# Patient Record
Sex: Male | Born: 1965 | Race: White | Hispanic: No | Smoking: Current every day smoker
Health system: Southern US, Community
[De-identification: ages and names within clinical notes are randomized; demographics above are authoritative.]

## PROBLEM LIST (undated history)

## (undated) DIAGNOSIS — E119 Type 2 diabetes mellitus without complications: Secondary | ICD-10-CM

## (undated) DIAGNOSIS — I10 Essential (primary) hypertension: Secondary | ICD-10-CM

## (undated) DIAGNOSIS — N200 Calculus of kidney: Secondary | ICD-10-CM

---

## 2020-12-22 ENCOUNTER — Encounter: Payer: Self-pay | Admitting: Intensive Care

## 2020-12-22 ENCOUNTER — Emergency Department: Payer: 59

## 2020-12-22 ENCOUNTER — Emergency Department
Admission: EM | Admit: 2020-12-22 | Discharge: 2020-12-22 | Disposition: A | Discharge status: Home / Self Care | Payer: 59 | Attending: Emergency Medicine | Admitting: Emergency Medicine

## 2020-12-22 ENCOUNTER — Other Ambulatory Visit: Payer: Self-pay

## 2020-12-22 DIAGNOSIS — Z7984 Long term (current) use of oral hypoglycemic drugs: Secondary | ICD-10-CM | POA: Diagnosis not present

## 2020-12-22 DIAGNOSIS — E119 Type 2 diabetes mellitus without complications: Secondary | ICD-10-CM | POA: Insufficient documentation

## 2020-12-22 DIAGNOSIS — F17210 Nicotine dependence, cigarettes, uncomplicated: Secondary | ICD-10-CM | POA: Insufficient documentation

## 2020-12-22 DIAGNOSIS — R109 Unspecified abdominal pain: Secondary | ICD-10-CM | POA: Diagnosis present

## 2020-12-22 DIAGNOSIS — I10 Essential (primary) hypertension: Secondary | ICD-10-CM | POA: Insufficient documentation

## 2020-12-22 DIAGNOSIS — Z79899 Other long term (current) drug therapy: Secondary | ICD-10-CM | POA: Insufficient documentation

## 2020-12-22 DIAGNOSIS — N200 Calculus of kidney: Secondary | ICD-10-CM | POA: Diagnosis not present

## 2020-12-22 HISTORY — DX: Type 2 diabetes mellitus without complications: E11.9

## 2020-12-22 HISTORY — DX: Essential (primary) hypertension: I10

## 2020-12-22 HISTORY — DX: Calculus of kidney: N20.0

## 2020-12-22 LAB — BASIC METABOLIC PANEL
Anion gap: 11 (ref 5–15)
BUN: 24 mg/dL — ABNORMAL HIGH (ref 6–20)
CO2: 24 mmol/L (ref 22–32)
Calcium: 9.3 mg/dL (ref 8.9–10.3)
Chloride: 99 mmol/L (ref 98–111)
Creatinine, Ser: 1.46 mg/dL — ABNORMAL HIGH (ref 0.61–1.24)
GFR, Estimated: 57 mL/min — ABNORMAL LOW (ref >60)
Glucose, Bld: 109 mg/dL — ABNORMAL HIGH (ref 70–99)
Potassium: 3.8 mmol/L (ref 3.5–5.1)
Sodium: 134 mmol/L — ABNORMAL LOW (ref 135–145)

## 2020-12-22 LAB — URINALYSIS, COMPLETE (UACMP) WITH MICROSCOPIC
Bacteria, UA: NONE SEEN
Bilirubin Urine: NEGATIVE
Glucose, UA: >=500 mg/dL — AB
Hgb urine dipstick: NEGATIVE
Ketones, ur: 5 mg/dL — AB
Leukocytes,Ua: NEGATIVE
Nitrite: NEGATIVE
Protein, ur: NEGATIVE mg/dL
Specific Gravity, Urine: 1.027 (ref 1.005–1.030)
Squamous Epithelial / HPF: NONE SEEN (ref 0–5)
pH: 5 (ref 5.0–8.0)

## 2020-12-22 LAB — CBC
HCT: 42 % (ref 39.0–52.0)
Hemoglobin: 14.5 g/dL (ref 13.0–17.0)
MCH: 29.5 pg (ref 26.0–34.0)
MCHC: 34.5 g/dL (ref 30.0–36.0)
MCV: 85.4 fL (ref 80.0–100.0)
Platelets: 256 10*3/uL (ref 150–400)
RBC: 4.92 MIL/uL (ref 4.22–5.81)
RDW: 12.5 % (ref 11.5–15.5)
WBC: 16.3 10*3/uL — ABNORMAL HIGH (ref 4.0–10.5)
nRBC: 0 % (ref 0.0–0.2)

## 2020-12-22 MED ORDER — OXYCODONE-ACETAMINOPHEN 5-325 MG PO TABS
1.0000 | ORAL_TABLET | ORAL | Status: DC | PRN
Start: 2020-12-22 — End: 2020-12-23
  Administered 2020-12-22: 1 via ORAL
  Filled 2020-12-22: qty 1

## 2020-12-22 MED ORDER — ONDANSETRON 4 MG PO TBDP
4.0000 mg | ORAL_TABLET | Freq: Once | ORAL | Status: AC
  Administered 2020-12-22: 4 mg via ORAL
  Filled 2020-12-22: qty 1

## 2020-12-22 MED ORDER — ONDANSETRON 4 MG PO TBDP
ORAL_TABLET | ORAL | Status: AC
  Administered 2020-12-22: 4 mg via ORAL
  Filled 2020-12-22: qty 1

## 2020-12-22 MED ORDER — OXYCODONE-ACETAMINOPHEN 5-325 MG PO TABS
1.0000 | ORAL_TABLET | Freq: Once | ORAL | Status: AC
  Administered 2020-12-22: 1 via ORAL
  Filled 2020-12-22: qty 1

## 2020-12-22 MED ORDER — TAMSULOSIN HCL 0.4 MG PO CAPS: 0.4000 mg | ORAL_CAPSULE | Freq: Every day | ORAL | 0 refills | Status: AC

## 2020-12-22 MED ORDER — ONDANSETRON 4 MG PO TBDP: 4.0000 mg | ORAL_TABLET | Freq: Once | ORAL | Status: AC | PRN

## 2020-12-22 MED ORDER — OXYCODONE-ACETAMINOPHEN 5-325 MG PO TABS
1.0000 | ORAL_TABLET | Freq: Four times a day (QID) | ORAL | 0 refills | Status: AC | PRN
Start: 2020-12-22 — End: 2020-12-27

## 2020-12-22 NOTE — ED Provider Notes (Signed)
[facility]-EMERGENCY DEPARTMENT  ____________________________________________  Time seen: Approximately 7:12 PM  I have reviewed the triage vital signs and the nursing notes.   HISTORY  Chief Complaint Flank Pain   Historian Patient     HPI Samuel Ingram is a 55 y.o. male with a history of diabetes, hypertension nephrolithiasis, presents to the emergency department with left-sided flank pain for the past 4 days.  Patient endorses nausea but no vomiting.  Denies fever and chills.  He states that flank pain seems to radiate to the groin.  He states that pain feels similar to episodes of nephrolithiasis in the past.  No falls or mechanisms of trauma.  No other alleviating measures have been attempted   Past Medical History:  Diagnosis Date  . Diabetes mellitus without complication (HCC)   . Hypertension   . Kidney stones      Immunizations up to date:  Yes.     Past Medical History:  Diagnosis Date  . Diabetes mellitus without complication (HCC)   . Hypertension   . Kidney stones     There are no problems to display for this patient.   History reviewed. No pertinent surgical history.  Prior to Admission medications   Medication Sig Start Date End Date Taking? Authorizing Provider  atorvastatin (LIPITOR) 40 MG tablet Take 40 mg by mouth daily.   Yes [provider]  carvedilol (COREG) 12.5 MG tablet Take 12.5 mg by mouth 2 (two) times daily with a meal.   Yes [provider]  empagliflozin (JARDIANCE) 10 MG TABS tablet Take by mouth daily.   Yes [provider]  furosemide (LASIX) 40 MG tablet Take 40 mg by mouth.   Yes [provider]  losartan (COZAAR) 50 MG tablet Take 50 mg by mouth daily.   Yes [provider]  metFORMIN (GLUCOPHAGE) 1000 MG tablet Take 1,000 mg by mouth 2 (two) times daily with a meal.   Yes [provider]  oxyCODONE-acetaminophen (PERCOCET/ROXICET) 5-325 MG tablet Take 1 tablet by mouth  every 6 (six) hours as needed for up to 5 days. 12/22/20 12/27/20 Yes Pia MauWoods, Catherene Kaleta M, PA-C  potassium chloride SA (KLOR-CON) 20 MEQ tablet Take 20 mEq by mouth 2 (two) times daily.   Yes [provider]  Spironolactone 25 MG/5ML SUSP Take by mouth.   Yes [provider]  tamsulosin (FLOMAX) 0.4 MG CAPS capsule Take 1 capsule (0.4 mg total) by mouth daily for 7 days. 12/22/20 12/29/20 Yes Pia MauWoods, Sinai Illingworth M, PA-C    Allergies Penicillins  History reviewed. No pertinent family history.  Social History Social History   Tobacco Use  . Smoking status: Current Every Day Smoker    Types: Cigarettes  . Smokeless tobacco: Never Used  Substance Use Topics  . Alcohol use: Never  . Drug use: Never     Review of Systems  Constitutional: No fever/chills Eyes:  No discharge ENT: No upper respiratory complaints. Respiratory: no cough. No SOB/ use of accessory muscles to breath Gastrointestinal:   No nausea, no vomiting.  No diarrhea.  No constipation. Genitourinary: Patient has flank pain.  Musculoskeletal: Negative for musculoskeletal pain. Skin: Negative for rash, abrasions, lacerations, ecchymosis.    ____________________________________________   PHYSICAL EXAM:  VITAL SIGNS: ED Triage Vitals  Enc Vitals Group     BP 12/22/20 1717 (!) 168/91     Pulse Rate 12/22/20 1717 (!) 114     Resp 12/22/20 1717 18     Temp 12/22/20 1717 98.4 F (36.9  C)     Temp Source 12/22/20 1717 Oral     SpO2 12/22/20 1717 97 %     Weight 12/22/20 1743 240 lb (108.9 kg)     Height 12/22/20 1743 5\' 10"  (1.778 m)     Head Circumference --      Peak Flow --      Pain Score 12/22/20 1717 10     Pain Loc --      Pain Edu? --      Excl. in GC? --      Constitutional: Alert and oriented. Well appearing and in no acute distress. Eyes: Conjunctivae are normal. PERRL. EOMI. Head: Atraumatic. Cardiovascular: Normal rate, regular rhythm. Normal S1 and S2.  Good peripheral  circulation. Respiratory: Normal respiratory effort without tachypnea or retractions. Lungs CTAB. Good air entry to the bases with no decreased or absent breath sounds Gastrointestinal: Bowel sounds x 4 quadrants. Soft and nontender to palpation. No guarding or rigidity. No distention. Genitourinary: Patient has left sided flank pain.  Musculoskeletal: Full range of motion to all extremities. No obvious deformities noted Neurologic:  Normal for age. No gross focal neurologic deficits are appreciated.  Skin:  Skin is warm, dry and intact. No rash noted. Psychiatric: Mood and affect are normal for age. Speech and behavior are normal.   ____________________________________________   LABS (all labs ordered are listed, but only abnormal results are displayed)  Labs Reviewed  URINALYSIS, COMPLETE (UACMP) WITH MICROSCOPIC - Abnormal; Notable for the following components:      Result Value   Color, Urine YELLOW (*)    APPearance CLEAR (*)    Glucose, UA >=500 (*)    Ketones, ur 5 (*)    All other components within normal limits  BASIC METABOLIC PANEL - Abnormal; Notable for the following components:   Sodium 134 (*)    Glucose, Bld 109 (*)    BUN 24 (*)    Creatinine, Ser 1.46 (*)    GFR, Estimated 57 (*)    All other components within normal limits  CBC - Abnormal; Notable for the following components:   WBC 16.3 (*)    All other components within normal limits   ____________________________________________  EKG   ____________________________________________  RADIOLOGY 12/24/20, personally viewed and evaluated these images (plain radiographs) as part of my medical decision making, as well as reviewing the written report by the radiologist.    CT Renal Stone Study  Result Date: 12/22/2020 CLINICAL DATA:  Left flank pain EXAM: CT ABDOMEN AND PELVIS WITHOUT CONTRAST TECHNIQUE: Multidetector CT imaging of the abdomen and pelvis was performed following the standard  protocol without oral or IV contrast. COMPARISON:  None. FINDINGS: Lower chest: Lung bases are clear. Hepatobiliary: There is hepatic steatosis. No focal liver lesions are evident on this noncontrast enhanced study. Gallbladder is absent. There is no appreciable biliary duct dilatation. Pancreas: There is no pancreatic mass or inflammatory focus. Spleen: No splenic lesions are appreciable. Adrenals/Urinary Tract: Adrenals bilaterally appear unremarkable. Left kidney is subtly edematous with mild soft tissue stranding in the left perinephric fascia. There is no evident renal mass on either side. There is moderately severe hydronephrosis on the left. There is no appreciable hydronephrosis on the right. On the right, there is a 3 mm calculus in the lower pole region. On the left, there is a calculus in the lower pole region measuring 7 x 7 mm with an adjacent 5 x 4 mm calculus. There is a calculus in  the proximal left ureter slightly beyond the ureteropelvic junction at the L4 level measuring 7 x 6 mm. No other ureteral calculi are appreciable. The urinary bladder is midline. There is thickening of the urinary bladder wall. Stomach/Bowel: There is no appreciable bowel wall or mesenteric thickening. There is no appreciable bowel obstruction. Terminal ileum appears unremarkable. There is no evident free air or portal venous air. Vascular/Lymphatic: No abdominal aortic aneurysm. No appreciable vascular lesions evident on this noncontrast enhanced study. There is no evident adenopathy in the abdomen or pelvis by size criteria. Subcentimeter mid abdominal mesenteric lymph nodes are subcentimeter and considered nonspecific. Reproductive: There are prostatic calculi. Prostate and seminal vesicles are normal in size and contour. Other: Appendix appears normal. No abscess or ascites evident in the abdomen pelvis. There is localized mesenteric thickening in the mid abdomen. There is no distortion of vessels or bowel.  Subcentimeter mesenteric lymph nodes are noted in this area without adenopathy. This area of subtle mesenteric sclerosis spans a distance of 6.5 x 4.3 cm. Musculoskeletal: Degenerative change noted in the thoracic spine. No blastic or lytic bone lesions evident. No intramuscular lesions. IMPRESSION: 1. There is a 7 x 6 mm calculus in the proximal left ureter at the L4 level causing moderately severe hydronephrosis and proximal left ureterectasis. Left kidney is mildly edematous. 2. There are intrarenal calculi, larger on the left than on the right. 3. Urinary bladder wall thickening, a finding likely indicative of a degree of cystitis. 4. Mesenteric stranding noted in the mid abdomen without distortion of bowel or vessels. 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 This finding is likely benign but is felt to warrant a follow-up study in approximately 6 months to assess for clearing. 5. No bowel wall thickening or bowel obstruction. No abscess in the abdomen or pelvis. Appendix appears normal. 6.  Hepatic steatosis. Electronically Signed   By: Bretta Bang III M.D.   On: 12/22/2020 20:04    ____________________________________________    PROCEDURES  Procedure(s) performed:     Procedures     Medications  oxyCODONE-acetaminophen (PERCOCET/ROXICET) 5-325 MG per tablet 1 tablet (1 tablet Oral Given 12/22/20 1749)  ondansetron (ZOFRAN-ODT) disintegrating tablet 4 mg (4 mg Oral Given 12/22/20 1749)  oxyCODONE-acetaminophen (PERCOCET/ROXICET) 5-325 MG per tablet 1 tablet (1 tablet Oral Given 12/22/20 2023)  ondansetron (ZOFRAN-ODT) disintegrating tablet 4 mg (4 mg Oral Given 12/22/20 2024)     ____________________________________________   INITIAL IMPRESSION / ASSESSMENT AND PLAN / ED COURSE  Pertinent labs & imaging results that were available during my care of the patient were reviewed by me and considered in my medical decision making (see chart for details).      Assessment and  Plan:  Flank pain 55 year old male presents to the emergency department with left-sided flank pain for the past 4 days.  Patient was hypertensive at triage but vital signs were otherwise reassuring.  He had mild leukocytosis.  Creatinine was mildly elevated as well as BUN.  Urinalysis showed no signs of UTI.  CT renal stone study shows a 7 x 6 mm renal stone.  Patient was discharged with Flomax and Roxicet.  Referral to urology was given, Dr. Lonna Cobb.     ____________________________________________  FINAL CLINICAL IMPRESSION(S) / ED DIAGNOSES  Final diagnoses:  Nephrolithiasis      NEW MEDICATIONS STARTED DURING THIS VISIT:  ED Discharge Orders         Ordered    oxyCODONE-acetaminophen (PERCOCET/ROXICET) 5-325 MG tablet  Every 6 hours PRN  12/22/20 2030    tamsulosin (FLOMAX) 0.4 MG CAPS capsule  Daily        12/22/20 2030              This chart was dictated using voice recognition software/Dragon. Despite best efforts to proofread, errors can occur which can change the meaning. Any change was purely unintentional.     Orvil Feil, PA-C 12/22/20 2140    Gilles Chiquito, MD 12/23/20 [phone]

## 2020-12-22 NOTE — ED Triage Notes (Signed)
Left sided flank pain X4 days. HX kidney stones and reports feels the same. Urinary frequency with little results

## 2020-12-22 NOTE — ED Notes (Signed)
Provided Dc instructions. Verbalized understanding. Alert, oriented, and ambulatory on discharge. No acute distress.

## 2020-12-22 NOTE — ED Notes (Signed)
See triage note  Presents with left sided flank pain  States pain started 4 days ago   Hx of renal stones  No fever or n/v

## 2020-12-22 NOTE — ED Triage Notes (Signed)
First Nurse Note:  C/O left sided flank pain x 4 days.  States has had a kidney stone in the past and symptoms are similar.  AAOx3.  Skin warm and dry. NAD

## 2020-12-22 NOTE — Discharge Instructions (Signed)
Take Percocet as directed. Please start stool softener to avoid constipation. Please make follow-up appointment with urology.

## 2020-12-25 ENCOUNTER — Telehealth: Payer: Self-pay | Admitting: Urology

## 2020-12-25 NOTE — Telephone Encounter (Signed)
-----   Message from Riki Altes, MD sent at 12/25/2020  7:27 AM EST ----- Regarding: Appointment Seen in ED recently for stone.  Please schedule appointment with me

## 2020-12-25 NOTE — Telephone Encounter (Signed)
Left message for patient to call back to schedule a NP ER follow up appt with Dr. Lonna Cobb Anytime next available   [facility]

## 2021-10-28 IMAGING — CT CT RENAL STONE PROTOCOL
3 of 4 series · 8 of 46 positions shown, 15 images · non-contrast
Comparison: None.

CLINICAL DATA: Left flank pain

EXAM:
CT ABDOMEN AND PELVIS WITHOUT CONTRAST
TECHNIQUE: Multidetector CT imaging of the abdomen and pelvis was performed
following the standard protocol without oral or IV contrast.

[Series 4: lung bases · axial · 0.90mm/px · z∈[-682,-617]mm · 4 of 23 slices shown, 9 images]
[im 5/23  soft-tissue]
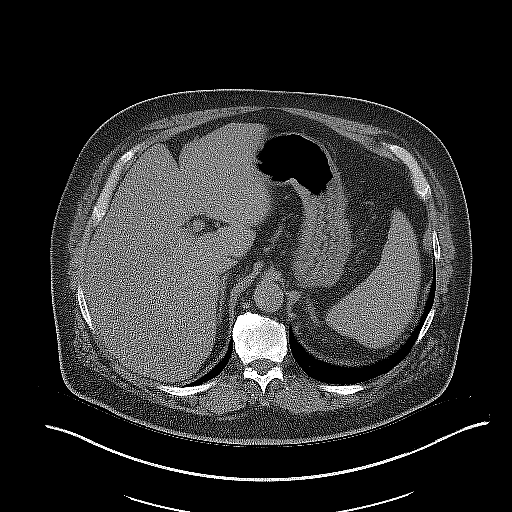
[im 5/23  lung]
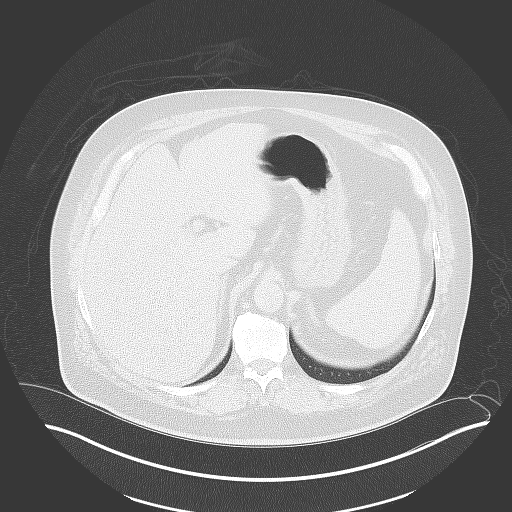
[im 5/23  bone]
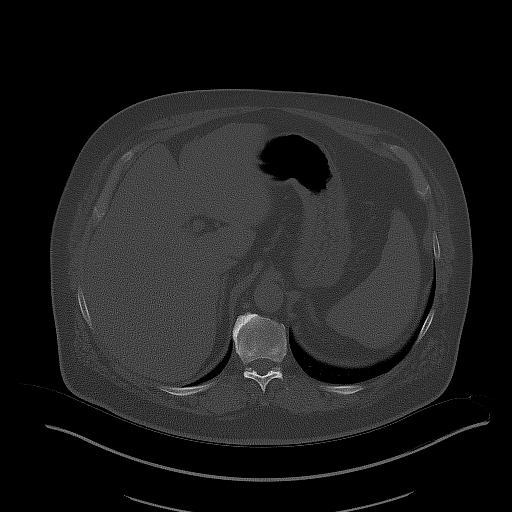
[im 9/23  soft-tissue]
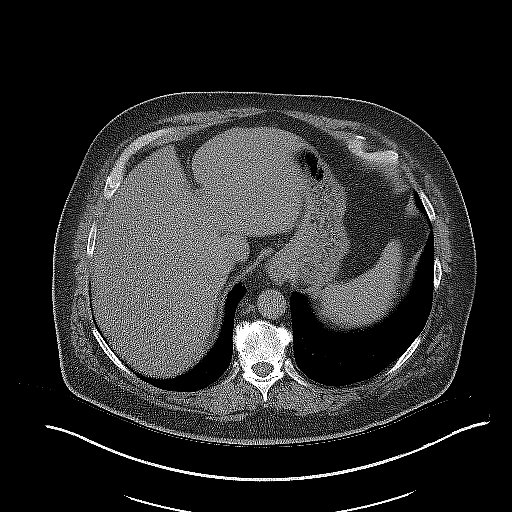
[im 9/23  lung]
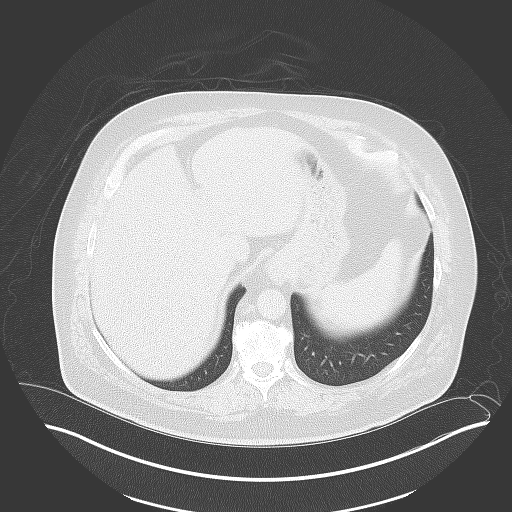
[im 14/23  soft-tissue]
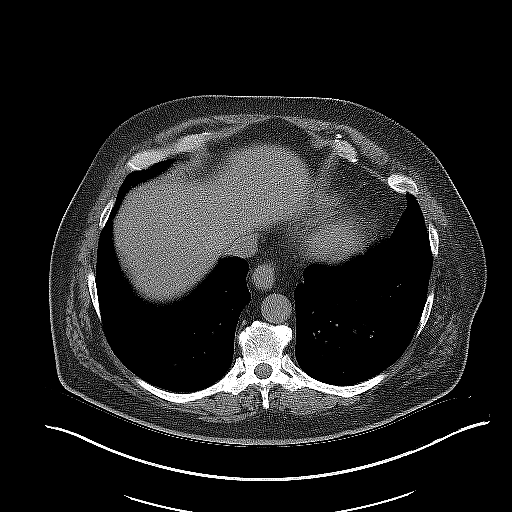
[im 14/23  lung]
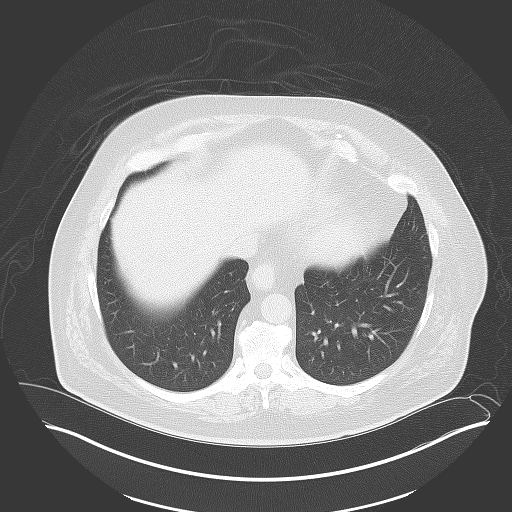
[im 18/23  soft-tissue]
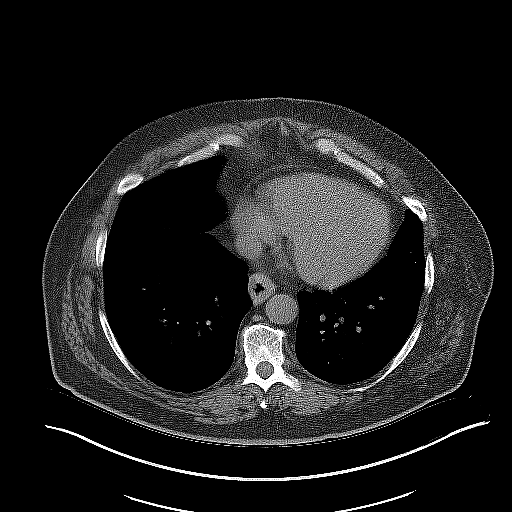
[im 18/23  lung]
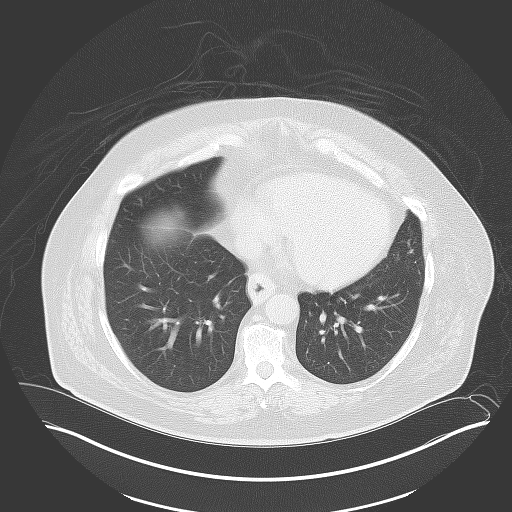

[Series 5: coronal · coronal · 0.87mm/px · 3 of 170 slices shown, 4 images]
[im 57/170  soft-tissue]
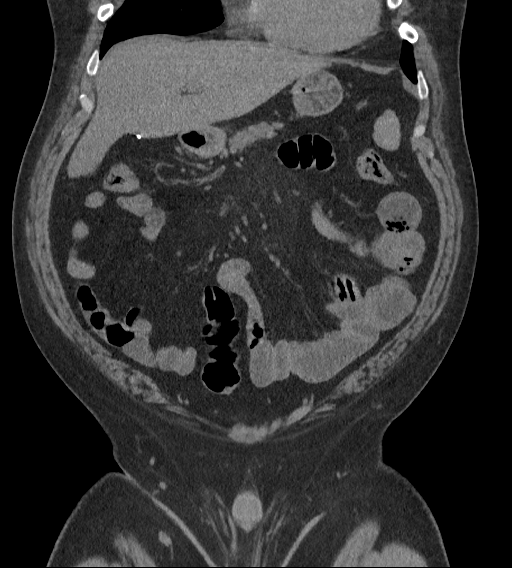
[im 76/170  soft-tissue]
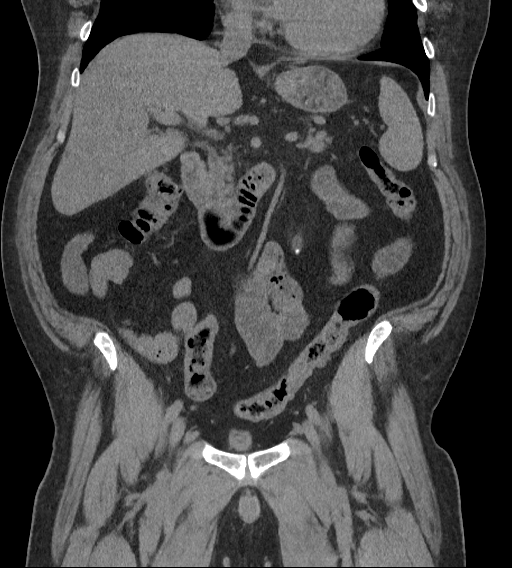
[im 76/170  bone]
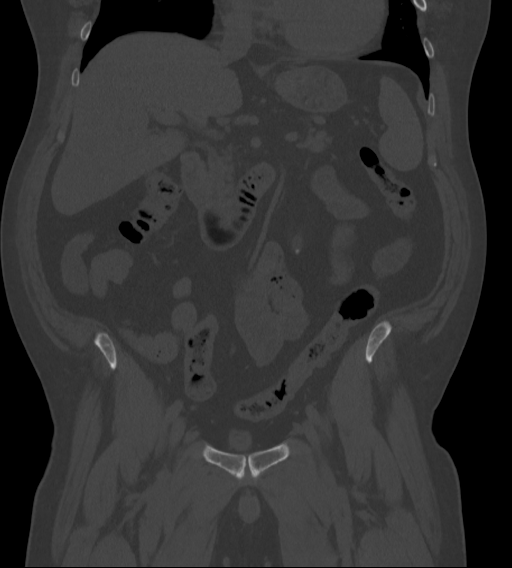
[im 94/170  soft-tissue]
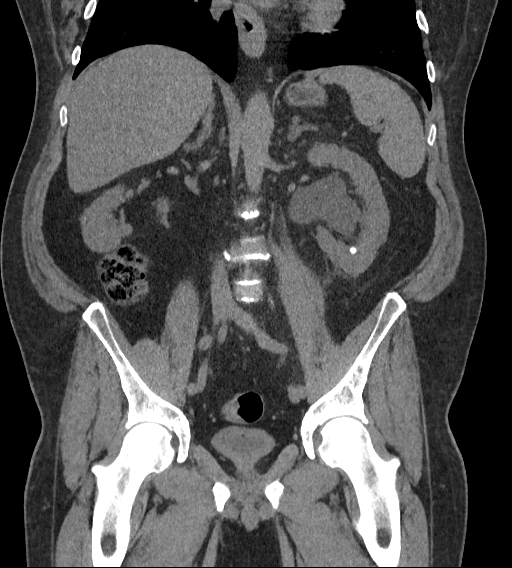

[Series 6: sagittal · sagittal · 0.74mm/px · 1 of 222 slices shown, 2 images]
[im 74/222  soft-tissue]
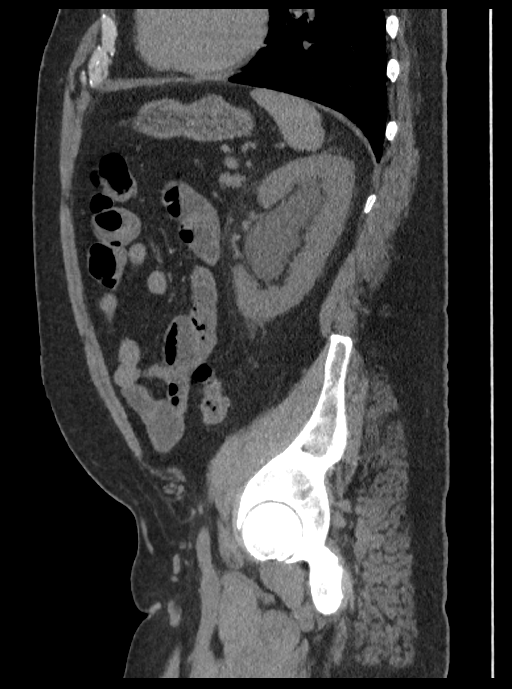
[im 74/222  bone]
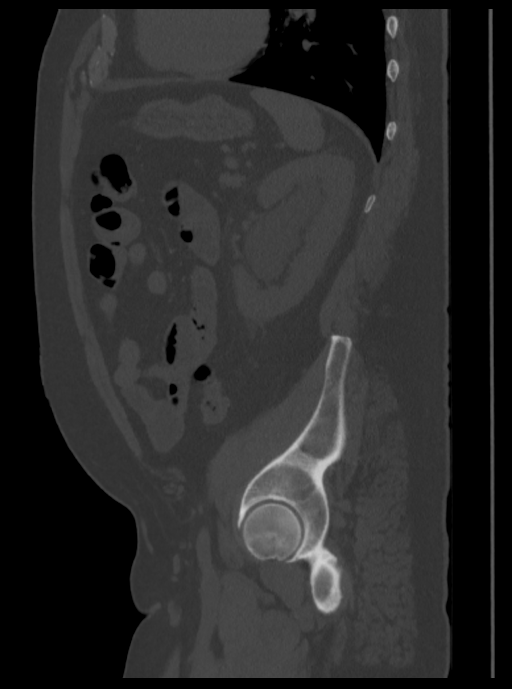

[8 of 46 positions shown; findings below may reference images not displayed]

FINDINGS: Lower chest: Lung bases are clear.

Hepatobiliary: There is hepatic steatosis. No focal liver lesions
are evident on this noncontrast enhanced study. Gallbladder is
absent. There is no appreciable biliary duct dilatation.

Pancreas: There is no pancreatic mass or inflammatory focus.

Spleen: No splenic lesions are appreciable.

Adrenals/Urinary Tract: Adrenals bilaterally appear unremarkable.
Left kidney is subtly edematous with mild soft tissue stranding in
the left perinephric fascia. There is no evident renal mass on
either side. There is moderately severe hydronephrosis on the left.
There is no appreciable hydronephrosis on the right. On the right,
there is a 3 mm calculus in the lower pole region. On the left,
there is a calculus in the lower pole region measuring 7 x 7 mm with
an adjacent 5 x 4 mm calculus. There is a calculus in the proximal
left ureter slightly beyond the ureteropelvic junction at the L4
level measuring 7 x 6 mm. No other ureteral calculi are appreciable.
The urinary bladder is midline. There is thickening of the urinary
bladder wall.

Stomach/Bowel: There is no appreciable bowel wall or mesenteric
thickening. There is no appreciable bowel obstruction. Terminal
ileum appears unremarkable. There is no evident free air or portal
venous air.

Vascular/Lymphatic: No abdominal aortic aneurysm. No appreciable
vascular lesions evident on this noncontrast enhanced study. There
is no evident adenopathy in the abdomen or pelvis by size criteria.
Subcentimeter mid abdominal mesenteric lymph nodes are subcentimeter
and considered nonspecific.

Reproductive: There are prostatic calculi. Prostate and seminal
vesicles are normal in size and contour.

Other: Appendix appears normal. No abscess or ascites evident in the
abdomen pelvis. There is localized mesenteric thickening in the mid
abdomen. There is no distortion of vessels or bowel. Subcentimeter
mesenteric lymph nodes are noted in this area without adenopathy.
This area of subtle mesenteric sclerosis spans a distance of 6.5 x
4.3 cm.

Musculoskeletal: Degenerative change noted in the thoracic spine. No
blastic or lytic bone lesions evident. No intramuscular lesions.
IMPRESSION: 1. There is a 7 x 6 mm calculus in the proximal left ureter at the
L4 level causing moderately severe hydronephrosis and proximal left
ureterectasis. Left kidney is mildly edematous.

2. There are intrarenal calculi, larger on the left than on the
right.

3. Urinary bladder wall thickening, a finding likely indicative of a
degree of cystitis.

4. Mesenteric stranding noted in the mid abdomen without distortion
of bowel or vessels. Suspect a degree of sclerosing mesenteritis.
This finding is likely benign but is felt to warrant a follow-up
study in approximately 6 months to assess for clearing.

5. No bowel wall thickening or bowel obstruction. No abscess in the
abdomen or pelvis. Appendix appears normal.

6.  Hepatic steatosis.
